# Patient Record
Sex: Female | Born: 1975 | Race: White | Hispanic: No | Marital: Single | Smoking: Never smoker
Health system: Southern US, Community
[De-identification: ages and names within clinical notes are randomized; demographics above are authoritative.]

## PROBLEM LIST (undated history)

## (undated) DIAGNOSIS — J45909 Unspecified asthma, uncomplicated: Secondary | ICD-10-CM

---

## 2012-05-15 ENCOUNTER — Emergency Department: Payer: Self-pay | Admitting: Emergency Medicine

## 2013-12-09 IMAGING — CT CT MAXILLOFACIAL WITHOUT CONTRAST
1 series · 16 of 30 positions shown, 20 images · non-contrast
Comparison: No comparison

REASON FOR EXAM: trauma, severe swellling
COMMENTS:

PROCEDURE:     CT  - CT MAXILLOFACIAL AREA WO  - May 15, 2012  [DATE]
RESULT:     History: Trauma
TECHNIQUE: Multiple axial images obtained of the maxillofacial bones with
coronal reformatted images provided.

[Series 4: coronal · coronal · 0.31mm/px · 16 of 50 slices shown, 20 images]
[im 2/50  brain]
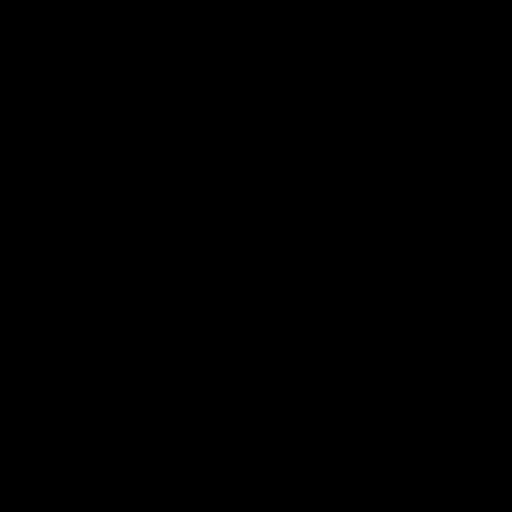
[im 2/50  bone]
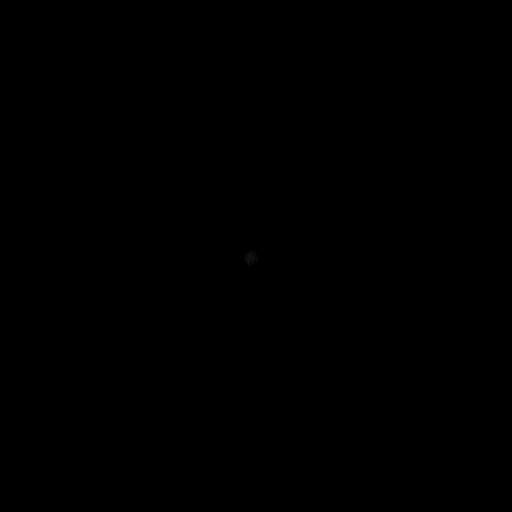
[im 6/50  bone]
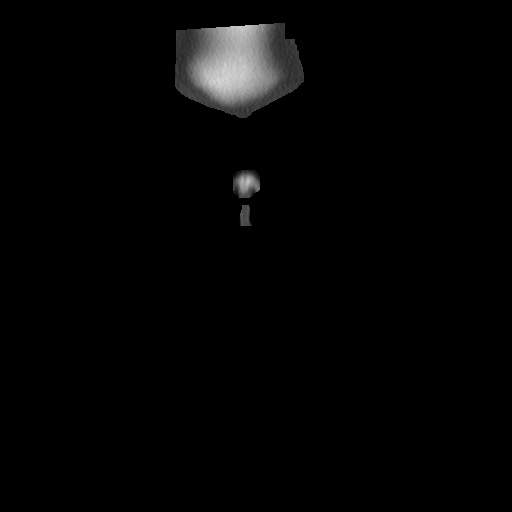
[im 9/50  bone]
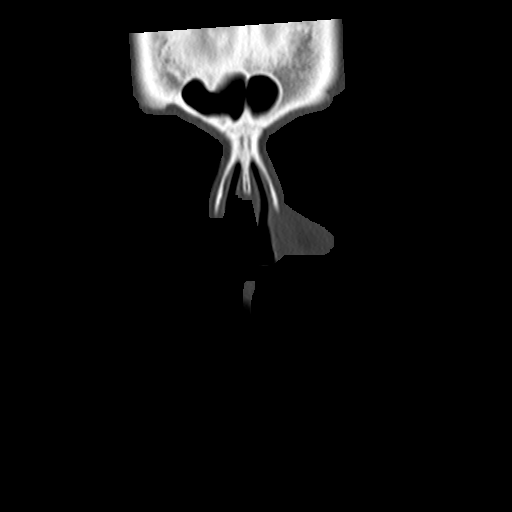
[im 12/50  bone]
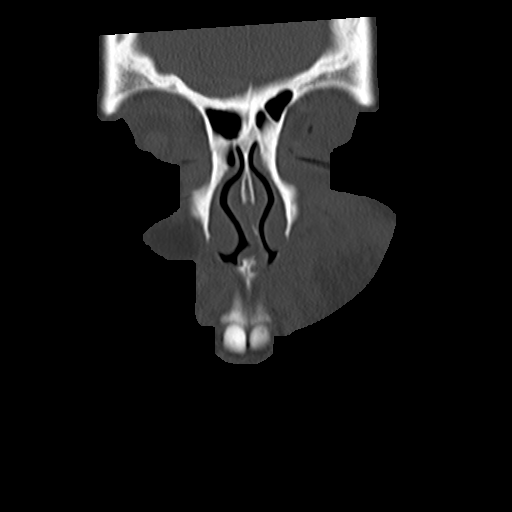
[im 14/50  brain]
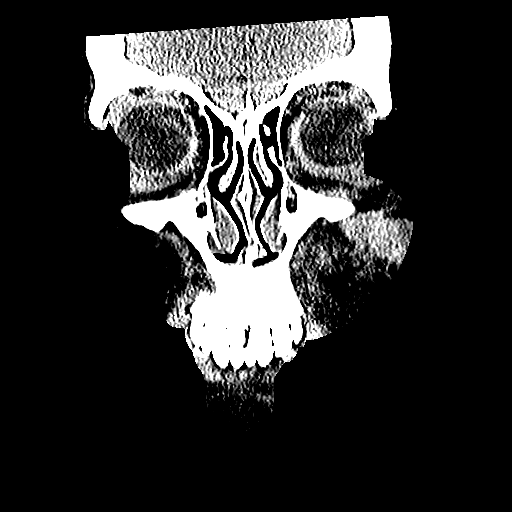
[im 14/50  bone]
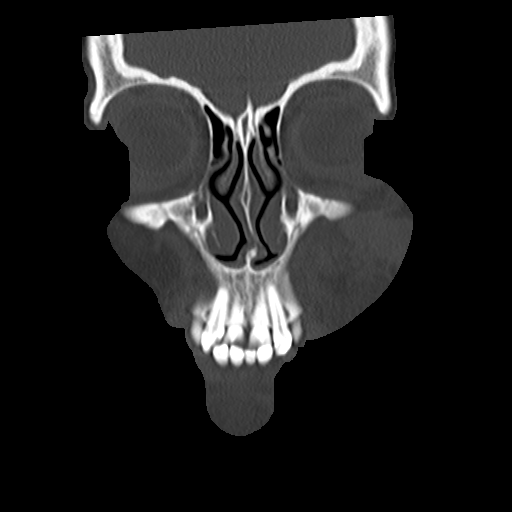
[im 17/50  bone]
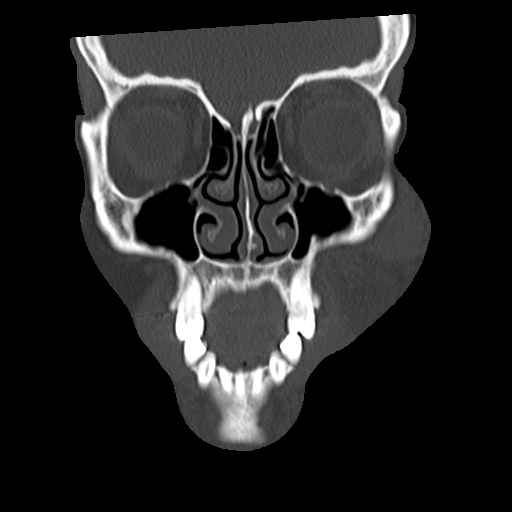
[im 21/50  bone]
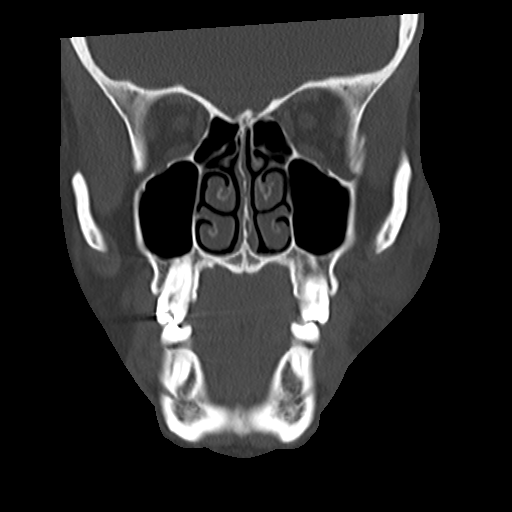
[im 24/50  bone]
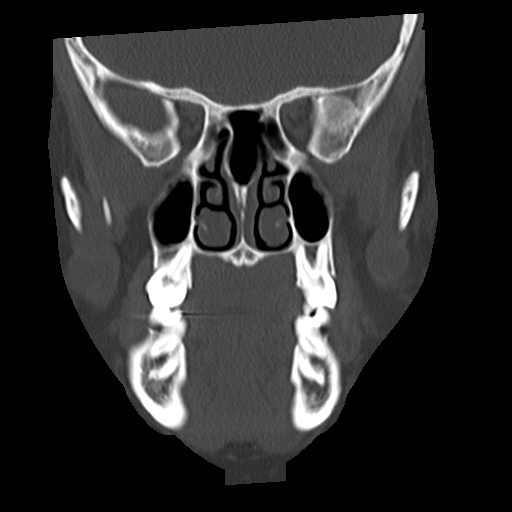
[im 26/50  brain]
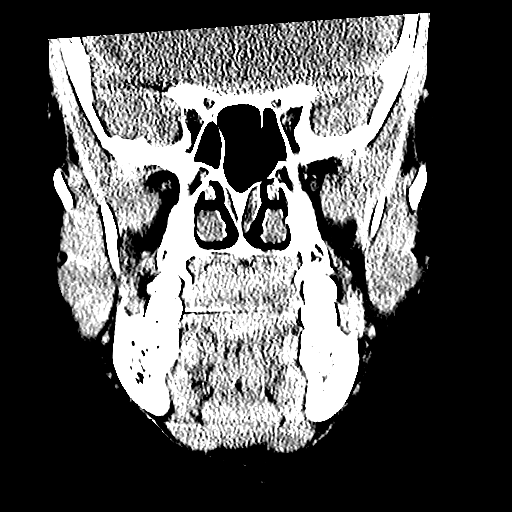
[im 26/50  bone]
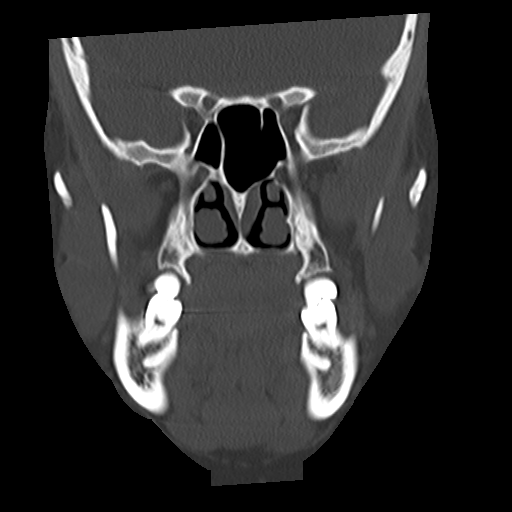
[im 29/50  bone]
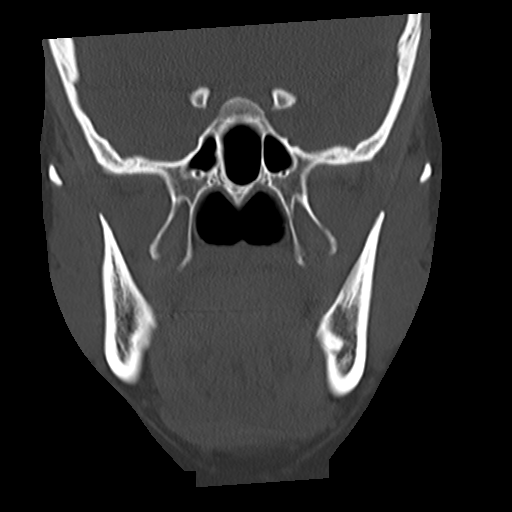
[im 33/50  bone]
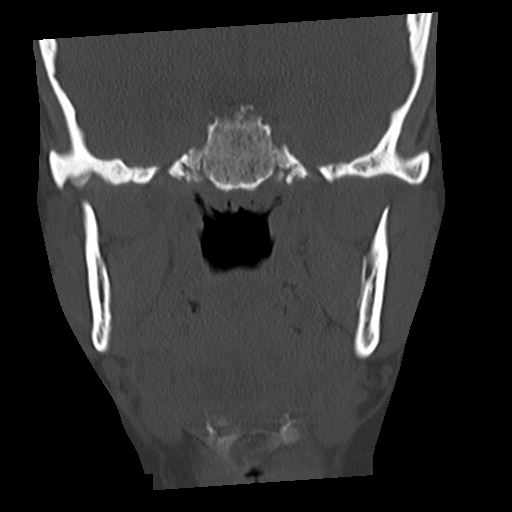
[im 36/50  bone]
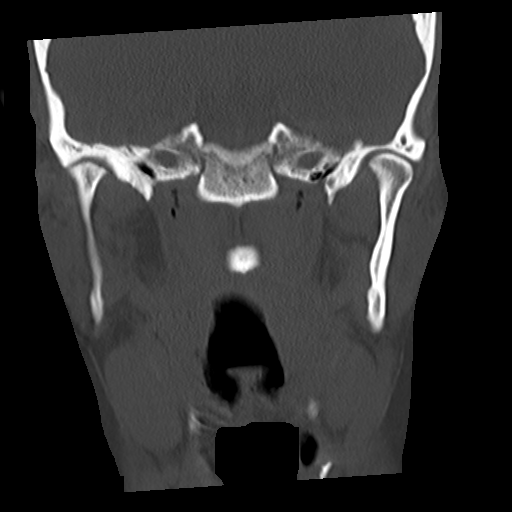
[im 38/50  brain]
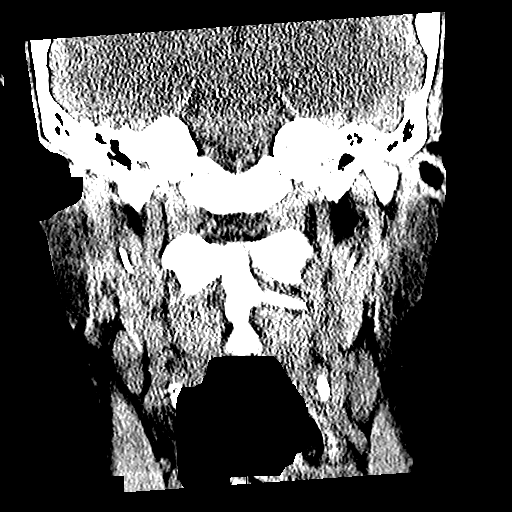
[im 38/50  bone]
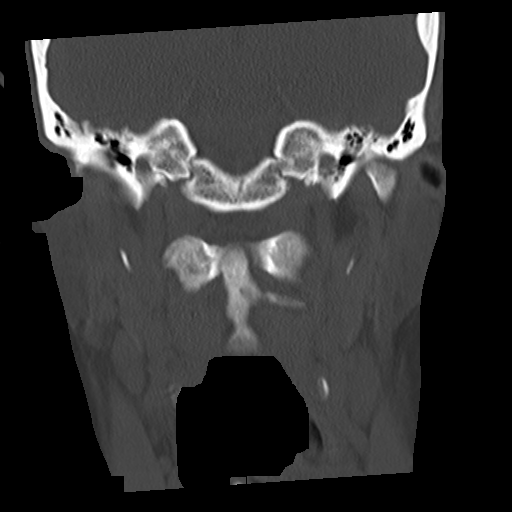
[im 41/50  bone]
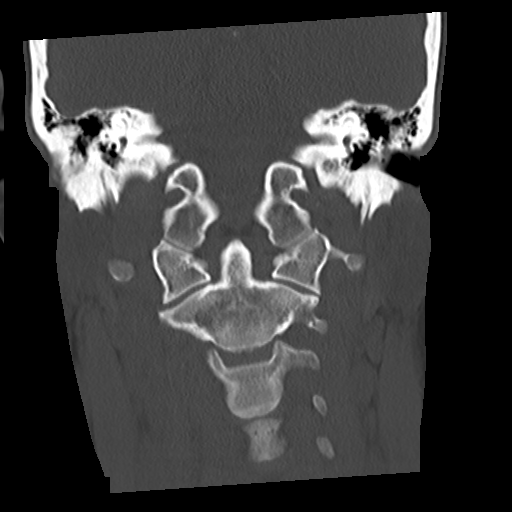
[im 44/50  bone]
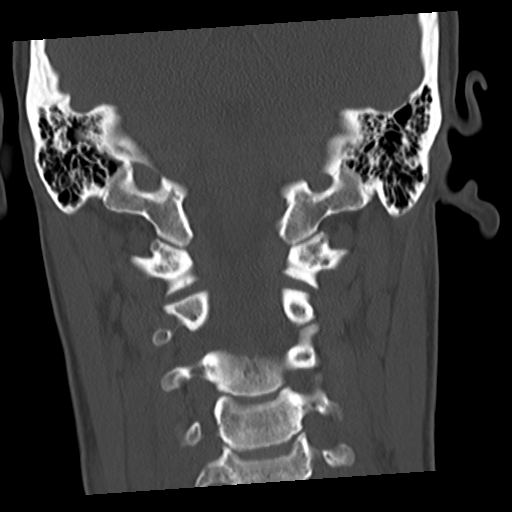
[im 48/50  bone]
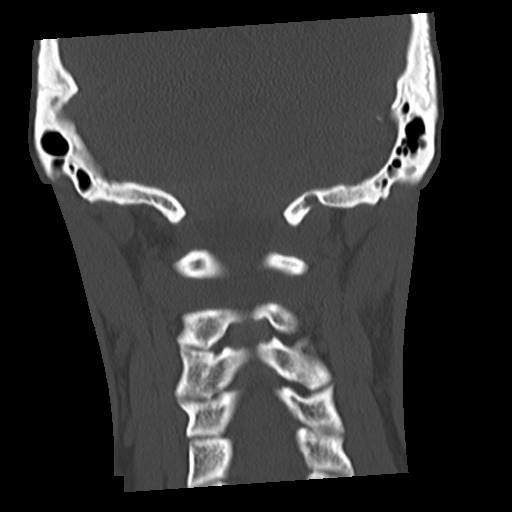

[16 of 30 positions shown; findings below may reference images not displayed]

FINDINGS: The globes are intact. The orbital walls are intact. The orbital floor is
intact. The maxilla and mandible are intact. The zygomatic arches are
intact. The nasal septum is midline. There is no nasal bone fracture. The
temporomandibular joints are normal.

The paranasal sinuses are clear. The visualized portions of the mastoid
sinuses are well aerated. There is severe soft tissue swelling and a
hematoma of the left cheek.
IMPRESSION: No acute osseous injury of the maxillofacial bones.

Severe soft tissue swelling and a hematoma of the left cheek.

[REDACTED]

## 2014-07-21 ENCOUNTER — Encounter (HOSPITAL_BASED_OUTPATIENT_CLINIC_OR_DEPARTMENT_OTHER): Payer: Self-pay | Admitting: *Deleted

## 2014-07-21 ENCOUNTER — Emergency Department (HOSPITAL_BASED_OUTPATIENT_CLINIC_OR_DEPARTMENT_OTHER)
Admission: EM | Admit: 2014-07-21 | Discharge: 2014-07-21 | Disposition: A | Discharge status: Home / Self Care | Payer: No Typology Code available for payment source | Attending: Emergency Medicine | Admitting: Emergency Medicine

## 2014-07-21 ENCOUNTER — Emergency Department (HOSPITAL_BASED_OUTPATIENT_CLINIC_OR_DEPARTMENT_OTHER): Payer: No Typology Code available for payment source

## 2014-07-21 DIAGNOSIS — S60512A Abrasion of left hand, initial encounter: Secondary | ICD-10-CM | POA: Diagnosis not present

## 2014-07-21 DIAGNOSIS — S93402A Sprain of unspecified ligament of left ankle, initial encounter: Secondary | ICD-10-CM | POA: Diagnosis not present

## 2014-07-21 DIAGNOSIS — W108XXA Fall (on) (from) other stairs and steps, initial encounter: Secondary | ICD-10-CM | POA: Insufficient documentation

## 2014-07-21 DIAGNOSIS — Y9301 Activity, walking, marching and hiking: Secondary | ICD-10-CM | POA: Diagnosis not present

## 2014-07-21 DIAGNOSIS — Y998 Other external cause status: Secondary | ICD-10-CM | POA: Insufficient documentation

## 2014-07-21 DIAGNOSIS — Y9289 Other specified places as the place of occurrence of the external cause: Secondary | ICD-10-CM | POA: Diagnosis not present

## 2014-07-21 DIAGNOSIS — Z79899 Other long term (current) drug therapy: Secondary | ICD-10-CM | POA: Diagnosis not present

## 2014-07-21 DIAGNOSIS — J45909 Unspecified asthma, uncomplicated: Secondary | ICD-10-CM | POA: Diagnosis not present

## 2014-07-21 DIAGNOSIS — S99922A Unspecified injury of left foot, initial encounter: Secondary | ICD-10-CM | POA: Diagnosis present

## 2014-07-21 HISTORY — DX: Unspecified asthma, uncomplicated: J45.909

## 2014-07-21 NOTE — Discharge Instructions (Signed)

## 2014-07-21 NOTE — ED Notes (Signed)
Injury to her left foot. She missed a step while walking down stairs. Felt a pop.

## 2014-07-21 NOTE — ED Provider Notes (Signed)
CSN: [id number]     Arrival date & time 07/21/14  2143 History  This chart was scribed for Elwin MochaBlair Verina Galeno, MD by Phillis HaggisGabriella Gaje, ED Scribe. This patient was seen in room MH11/MH11 and patient care was started at 10:02 PM.   Chief Complaint  Patient presents with  . Foot Injury   Patient is a 39 y.o. female presenting with foot injury. The history is provided by the patient. No language interpreter was used.  Foot Injury Location:  Foot Time since incident:  2 hours Injury: yes   Mechanism of injury: fall   Fall:    Fall occurred:  Down stairs   Impact surface:  Hard floor   Point of impact:  Outstretched arms and feet   Entrapped after fall: no   Foot location:  L foot Pain details:    Quality:  Throbbing   Radiates to:  Does not radiate   Severity:  Mild   Onset quality:  Sudden   Duration:  2 hours   Timing:  Constant   Progression:  Partially resolved Chronicity:  New Dislocation: no   Foreign body present:  No foreign bodies Relieved by:  Compression (ibuprofen) Associated symptoms: no muscle weakness, no numbness and no tingling     HPI Comments: Dana Chapman is a 39 y.o. female who presents to the Emergency Department complaining of a left foot injury onset 2 hours ago. She states that she was walking down the stairs when she missed a step and falling on the foot; heard a pop upon the fall. Reports abrasions to the palmar surface of the left hand. Denies hitting head, LOC, numbness, weakness, nausea, vomiting, or dizziness.   Past Medical History  Diagnosis Date  . Asthma    History reviewed. No pertinent past surgical history. No family history on file. History  Substance Use Topics  . Smoking status: Never Smoker   . Smokeless tobacco: Not on file  . Alcohol Use: Yes     Comment: every 3 weeks or so   OB History    No data available     Review of Systems  Gastrointestinal: Negative for nausea and vomiting.  Musculoskeletal: Positive for arthralgias.   Skin:       Abrasions to left hand  Neurological: Negative for dizziness, syncope, weakness, numbness and headaches.  All other systems reviewed and are negative.  Allergies  Fish allergy  Home Medications   Prior to Admission medications   Medication Sig Start Date End Date Taking? Authorizing Provider  ALBUTEROL IN Inhale into the lungs.   Yes Historical Provider, MD  Fluticasone-Salmeterol (ADVAIR DISKUS IN) Inhale into the lungs.   Yes Historical Provider, MD  Montelukast Sodium (SINGULAIR PO) Take by mouth.   Yes Historical Provider, MD   BP 126/75 mmHg  Pulse 85  Temp(Src) 98.7 F (37.1 C) (Oral)  Resp 18  Ht 5\' 7"  (1.702 m)  Wt 145 lb (65.772 kg)  BMI 22.71 kg/m2  SpO2 100%  LMP 07/14/2014 Physical Exam  Constitutional: She is oriented to person, place, and time. She appears well-developed and well-nourished. No distress.  HENT:  Head: Normocephalic and atraumatic.  Mouth/Throat: Oropharynx is clear and moist.  Eyes: EOM are normal. Pupils are equal, round, and reactive to light.  Neck: Normal range of motion. Neck supple.  Cardiovascular: Normal rate and regular rhythm.  Exam reveals no friction rub.   No murmur heard. Pulmonary/Chest: Effort normal and breath sounds normal. No respiratory distress. She has no  wheezes. She has no rales.  Abdominal: Soft. She exhibits no distension. There is no tenderness. There is no rebound.  Musculoskeletal: Normal range of motion. She exhibits no edema.       Left ankle: She exhibits swelling (Anterior and inferior to lateral malleolus). She exhibits normal range of motion, no deformity, no laceration and normal pulse. Tenderness. AITFL tenderness found. No lateral malleolus, no medial malleolus, no CF ligament, no posterior TFL, no head of 5th metatarsal and no proximal fibula tenderness found.       Feet:  Neurological: She is alert and oriented to person, place, and time.  Skin: She is not diaphoretic.  Nursing note and  vitals reviewed.   ED Course  Procedures (including critical care time) DIAGNOSTIC STUDIES: Oxygen Saturation is 100% on RA, normal by my interpretation.    COORDINATION OF CARE: 10:04 PM-Discussed treatment plan which includes x-ray with pt at bedside and pt agreed to plan.   Labs Review Labs Reviewed - No data to display  Imaging Review Dg Ankle Complete Left  07/21/2014   CLINICAL DATA:  39 year old female with fall and left ankle thickening  EXAM: LEFT ANKLE COMPLETE - 3+ VIEW  COMPARISON:  None  FINDINGS: There is no evidence of fracture, dislocation, or joint effusion. There is no evidence of arthropathy or other focal bone abnormality. Soft tissues are unremarkable. 5 mm calcaneal spur noted.  IMPRESSION: No acute fracture or dislocation.   Electronically Signed   By: Elgie Collard M.D.   On: 07/21/2014 22:29     EKG Interpretation None      MDM   Final diagnoses:  Ankle sprain, left, initial encounter    77F here with a foot injury. She fell down the steps twisting her left ankle and heard a pop. She is walking on it. He took Motrin for relief. She also applied an Ace wrap. Here she has swelling along the ligament complexes distal to the lateral malleolus. No bony tenderness. Normal range of motion. No proximal head of the fibula and no proximal fifth metatarsal tenderness. X-rays negative. Clinical exam consistent with strain versus sprain. Instructed to continue supportive care. I personally performed the services described in this documentation, which was scribed in my presence. The recorded information has been reviewed and is accurate.     Elwin Mocha, MD 07/21/14 2255

## 2016-02-14 IMAGING — DX DG ANKLE COMPLETE 3+V*L*
3 series · 3 of 3 positions shown · non-contrast
Comparison: None

CLINICAL DATA: 38-year-old female with fall and left ankle
thickening

EXAM:
LEFT ANKLE COMPLETE - 3+ VIEW

[ankle ap]
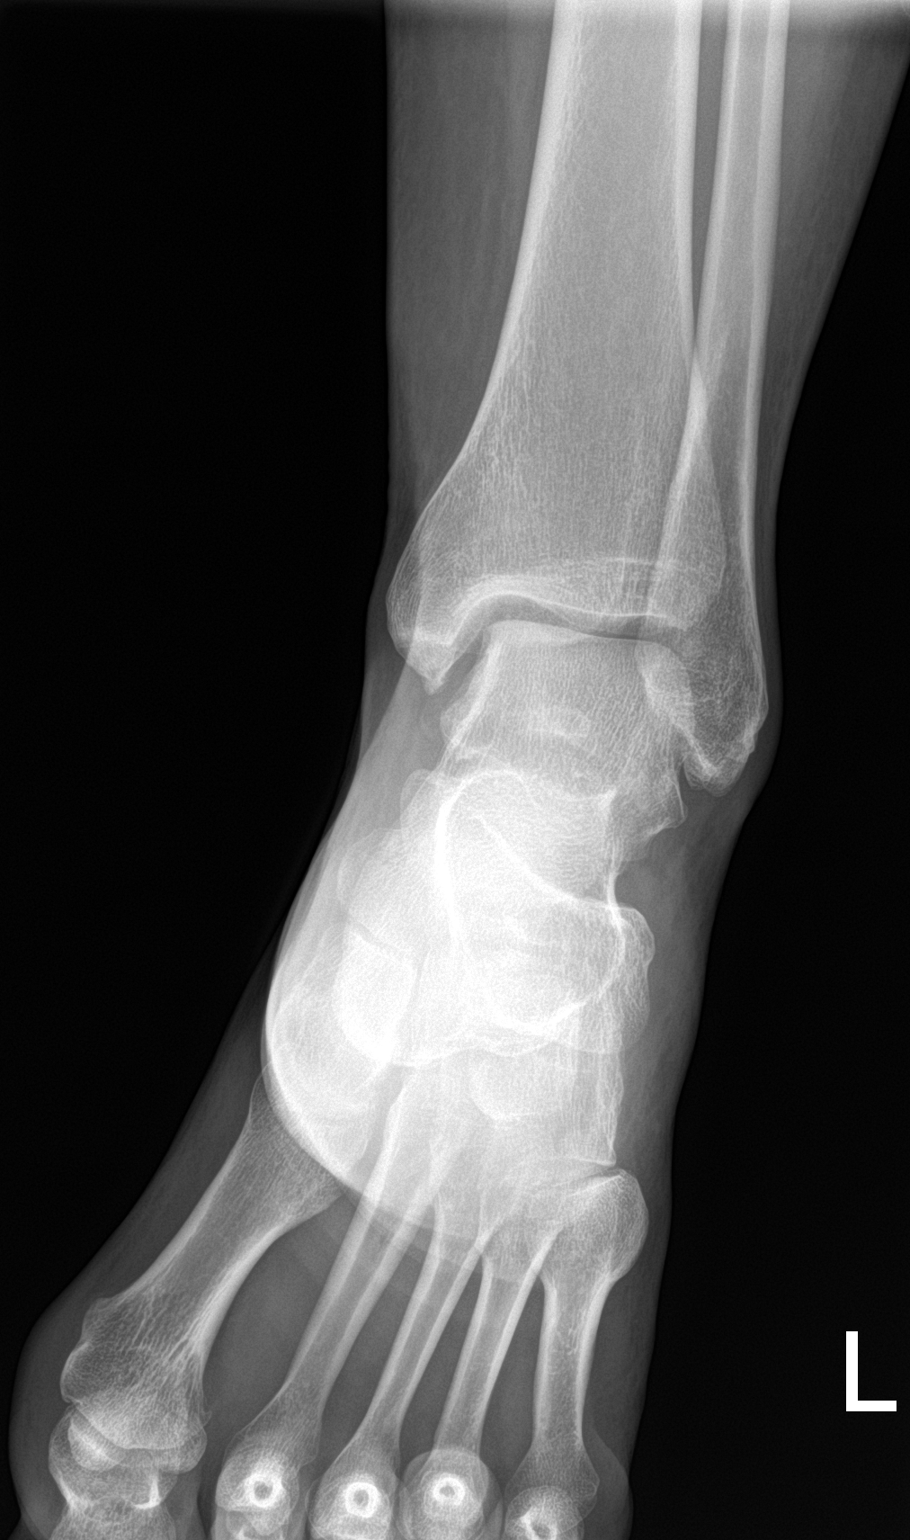

[ankle obl]
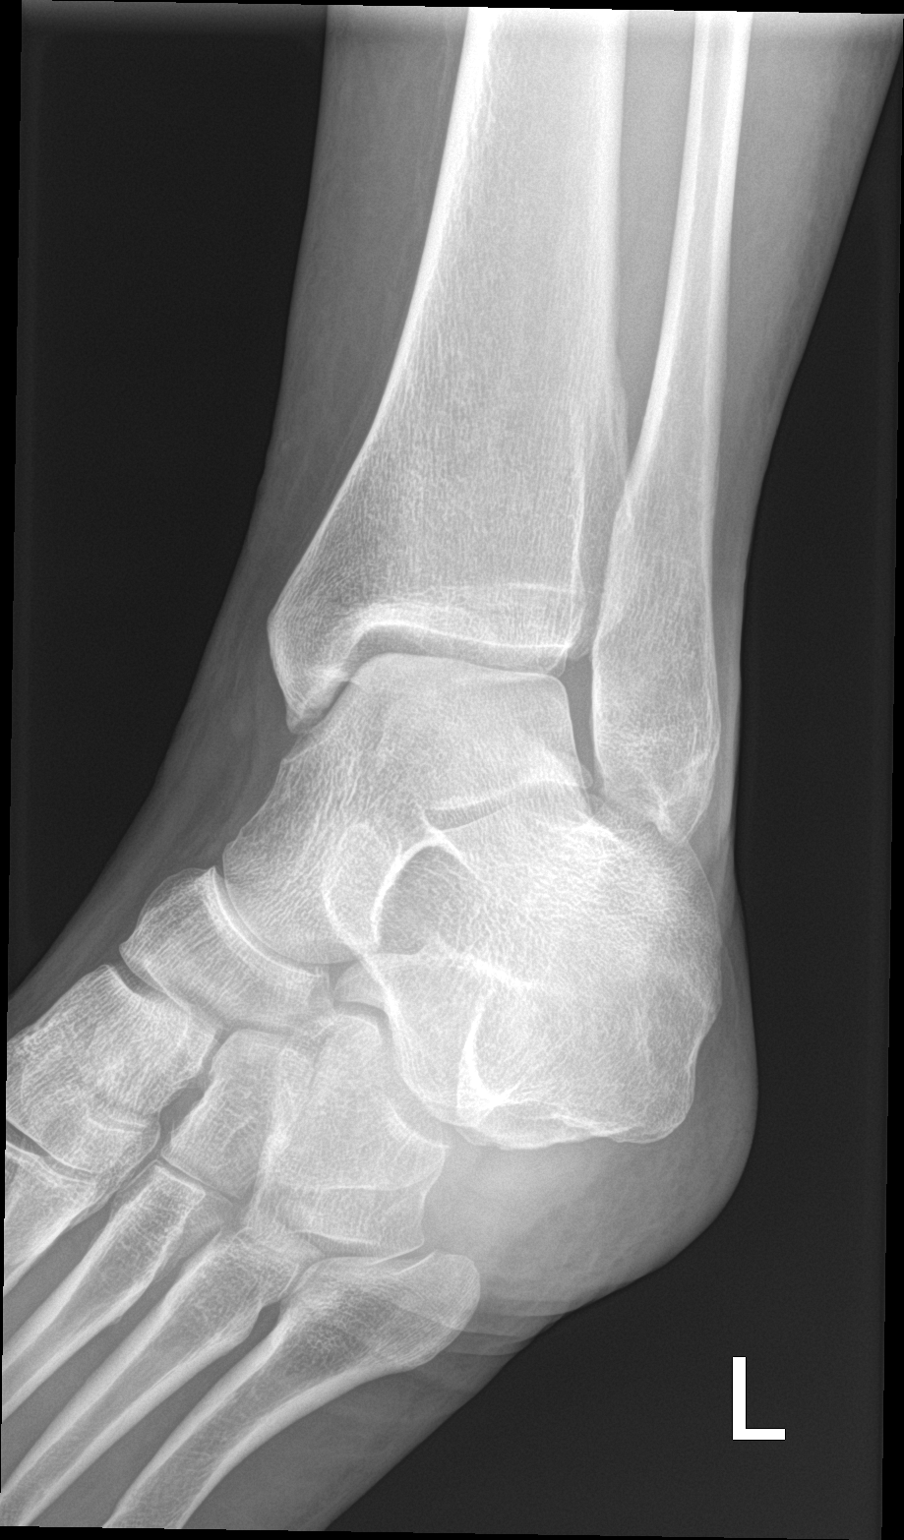

[ankle lat]
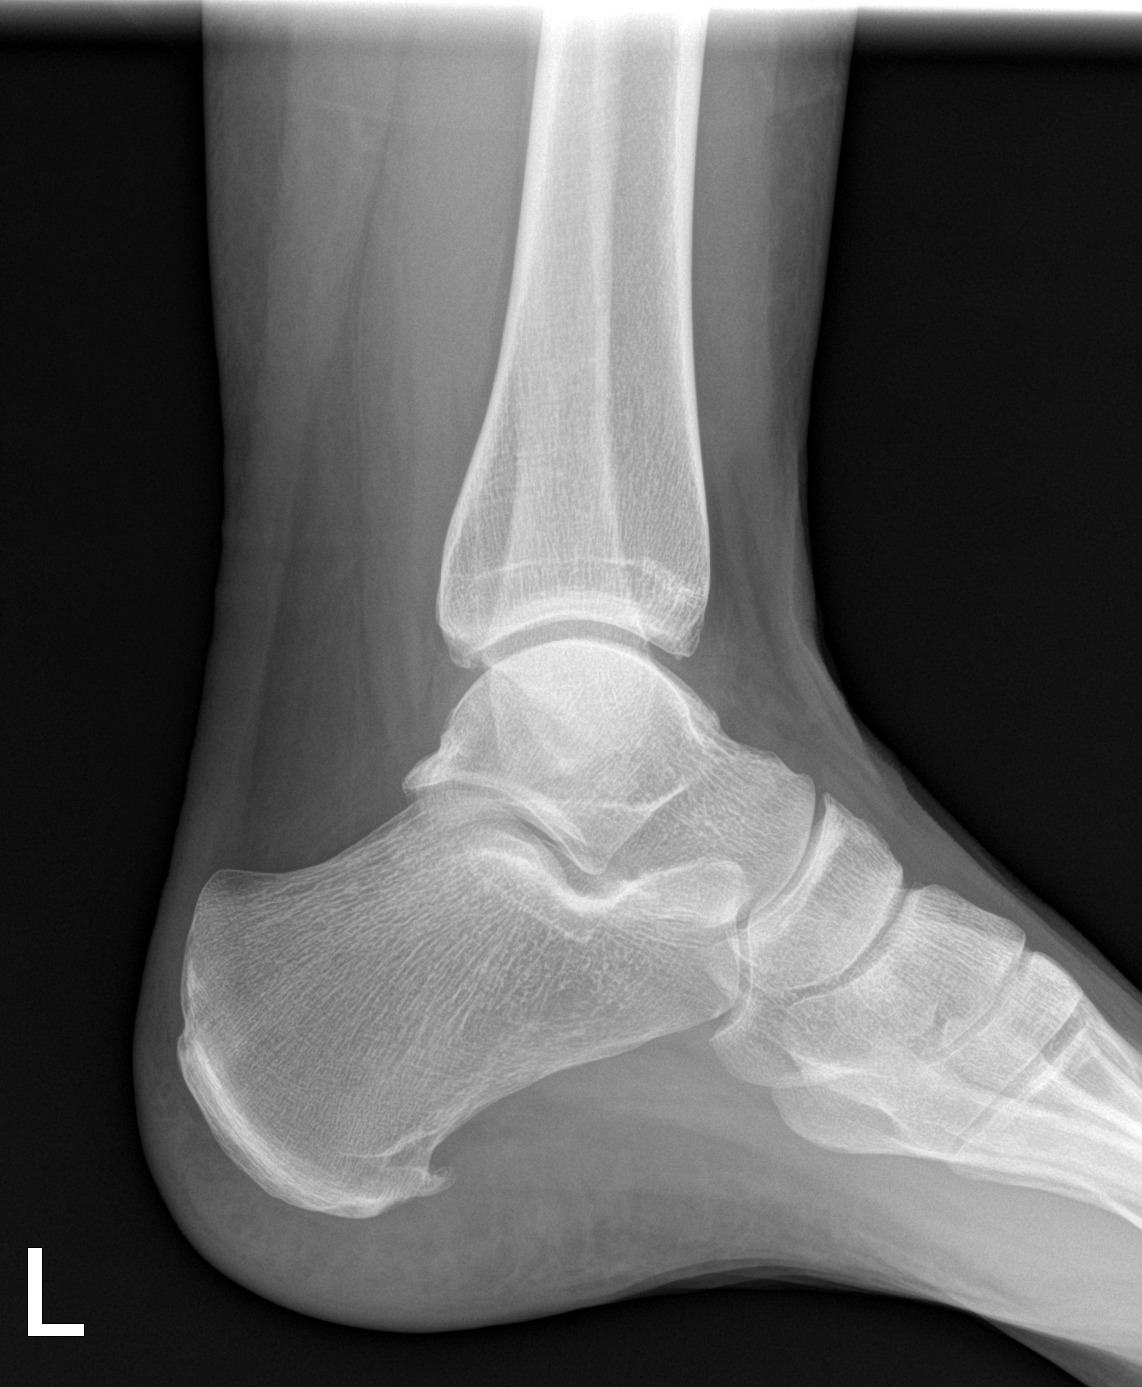

[3 of 3 positions shown; findings below may reference images not displayed]

FINDINGS: There is no evidence of fracture, dislocation, or joint effusion.
There is no evidence of arthropathy or other focal bone abnormality.
Soft tissues are unremarkable. 5 mm calcaneal spur noted.
IMPRESSION: No acute fracture or dislocation.
